# Patient Record
Sex: Female | Born: 1940 | Race: White | Hispanic: No | Marital: Married
Health system: Southern US, Community
[De-identification: ages and names within clinical notes are randomized; demographics above are authoritative.]

## PROBLEM LIST (undated history)

## (undated) HISTORY — PX: ABDOMINAL HYSTERECTOMY: SHX81

---

## 2016-09-16 ENCOUNTER — Other Ambulatory Visit: Payer: Self-pay | Admitting: Internal Medicine

## 2016-09-16 DIAGNOSIS — N183 Chronic kidney disease, stage 3 unspecified: Secondary | ICD-10-CM

## 2016-10-07 ENCOUNTER — Ambulatory Visit
Admission: RE | Admit: 2016-10-07 | Discharge: 2016-10-07 | Disposition: A | Discharge status: Home / Self Care | Payer: Medicare Other | Source: Ambulatory Visit | Attending: Internal Medicine | Admitting: Internal Medicine

## 2016-10-07 DIAGNOSIS — N183 Chronic kidney disease, stage 3 unspecified: Secondary | ICD-10-CM

## 2016-10-07 DIAGNOSIS — N261 Atrophy of kidney (terminal): Secondary | ICD-10-CM | POA: Insufficient documentation

## 2017-09-19 ENCOUNTER — Other Ambulatory Visit: Payer: Self-pay | Admitting: Internal Medicine

## 2017-09-19 DIAGNOSIS — Z1231 Encounter for screening mammogram for malignant neoplasm of breast: Secondary | ICD-10-CM

## 2017-10-21 ENCOUNTER — Encounter: Payer: Self-pay | Admitting: Radiology

## 2017-10-21 ENCOUNTER — Other Ambulatory Visit: Payer: Self-pay | Admitting: Internal Medicine

## 2017-10-21 ENCOUNTER — Ambulatory Visit
Admission: RE | Admit: 2017-10-21 | Discharge: 2017-10-21 | Disposition: A | Discharge status: Home / Self Care | Payer: Medicare HMO | Source: Ambulatory Visit | Attending: Internal Medicine | Admitting: Internal Medicine

## 2017-10-21 DIAGNOSIS — Z1231 Encounter for screening mammogram for malignant neoplasm of breast: Secondary | ICD-10-CM | POA: Insufficient documentation

## 2018-09-26 ENCOUNTER — Other Ambulatory Visit: Payer: Self-pay | Admitting: Internal Medicine

## 2018-09-26 DIAGNOSIS — Z1231 Encounter for screening mammogram for malignant neoplasm of breast: Secondary | ICD-10-CM

## 2019-01-17 ENCOUNTER — Ambulatory Visit
Admission: RE | Admit: 2019-01-17 | Discharge: 2019-01-17 | Disposition: A | Discharge status: Home / Self Care | Payer: Medicare HMO | Source: Ambulatory Visit | Attending: Internal Medicine | Admitting: Internal Medicine

## 2019-01-17 ENCOUNTER — Other Ambulatory Visit: Payer: Self-pay

## 2019-01-17 DIAGNOSIS — Z1231 Encounter for screening mammogram for malignant neoplasm of breast: Secondary | ICD-10-CM | POA: Insufficient documentation

## 2019-06-20 ENCOUNTER — Other Ambulatory Visit: Payer: Self-pay | Admitting: Nephrology

## 2019-06-20 DIAGNOSIS — M7989 Other specified soft tissue disorders: Secondary | ICD-10-CM

## 2019-06-20 DIAGNOSIS — R52 Pain, unspecified: Secondary | ICD-10-CM

## 2019-06-27 ENCOUNTER — Ambulatory Visit
Admission: RE | Admit: 2019-06-27 | Discharge: 2019-06-27 | Disposition: A | Discharge status: Home / Self Care | Payer: Medicare HMO | Source: Ambulatory Visit | Attending: Nephrology | Admitting: Nephrology

## 2019-06-27 ENCOUNTER — Other Ambulatory Visit: Payer: Self-pay

## 2019-06-27 DIAGNOSIS — M7989 Other specified soft tissue disorders: Secondary | ICD-10-CM | POA: Insufficient documentation

## 2019-06-27 DIAGNOSIS — R52 Pain, unspecified: Secondary | ICD-10-CM | POA: Diagnosis present

## 2019-12-16 IMAGING — MG DIGITAL SCREENING BILATERAL MAMMOGRAM WITH TOMO AND CAD
6 of 10 series · 6 of 30 positions shown · non-contrast
Comparison: Previous exam(s).

CLINICAL DATA: Screening.

EXAM:
DIGITAL SCREENING BILATERAL MAMMOGRAM WITH TOMO AND CAD

[R MLO synth-2D (1 of 2)]
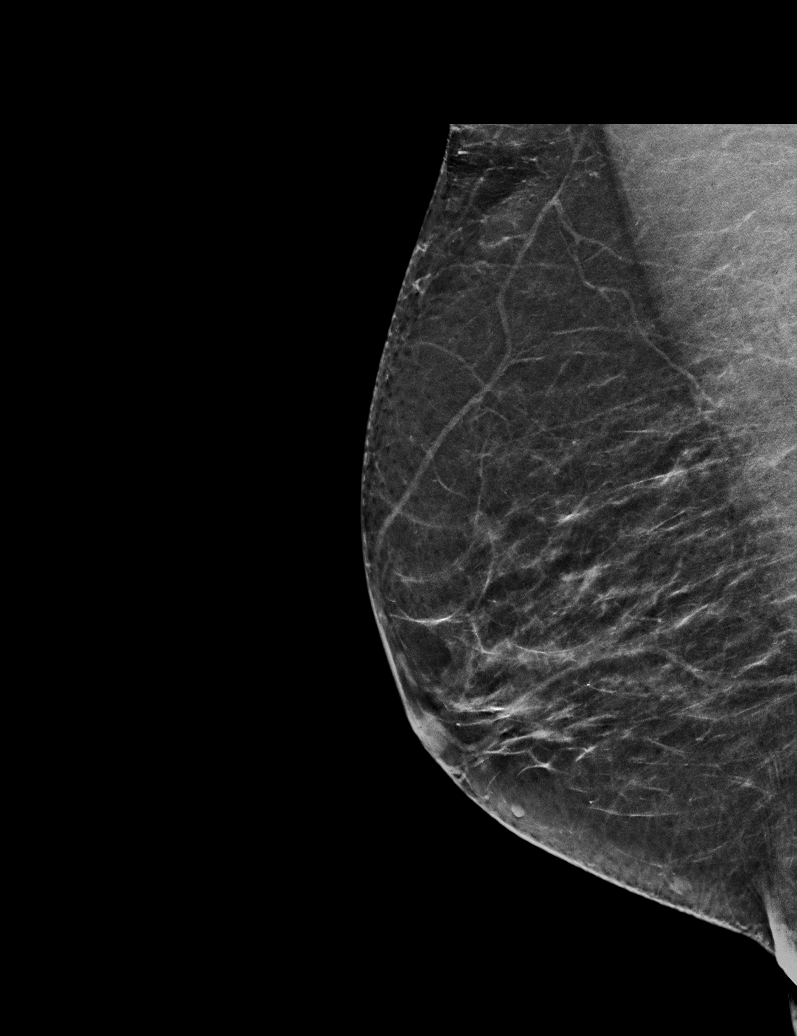

[L CC synth-2D]
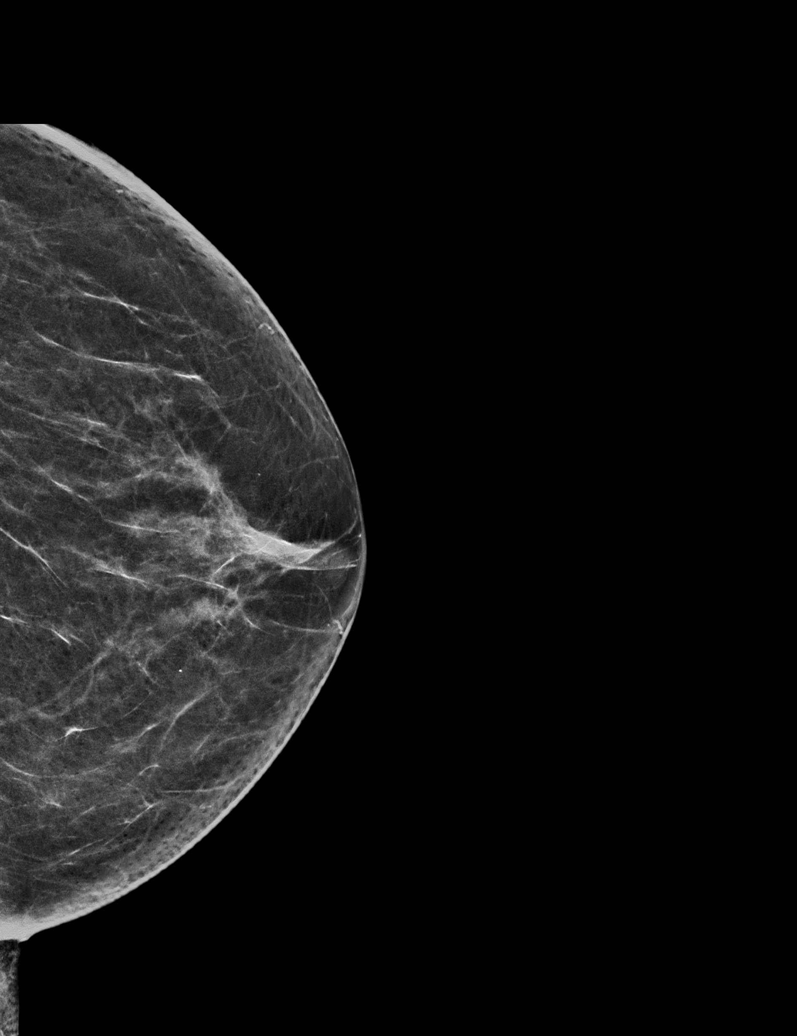

[L MLO synth-2D]
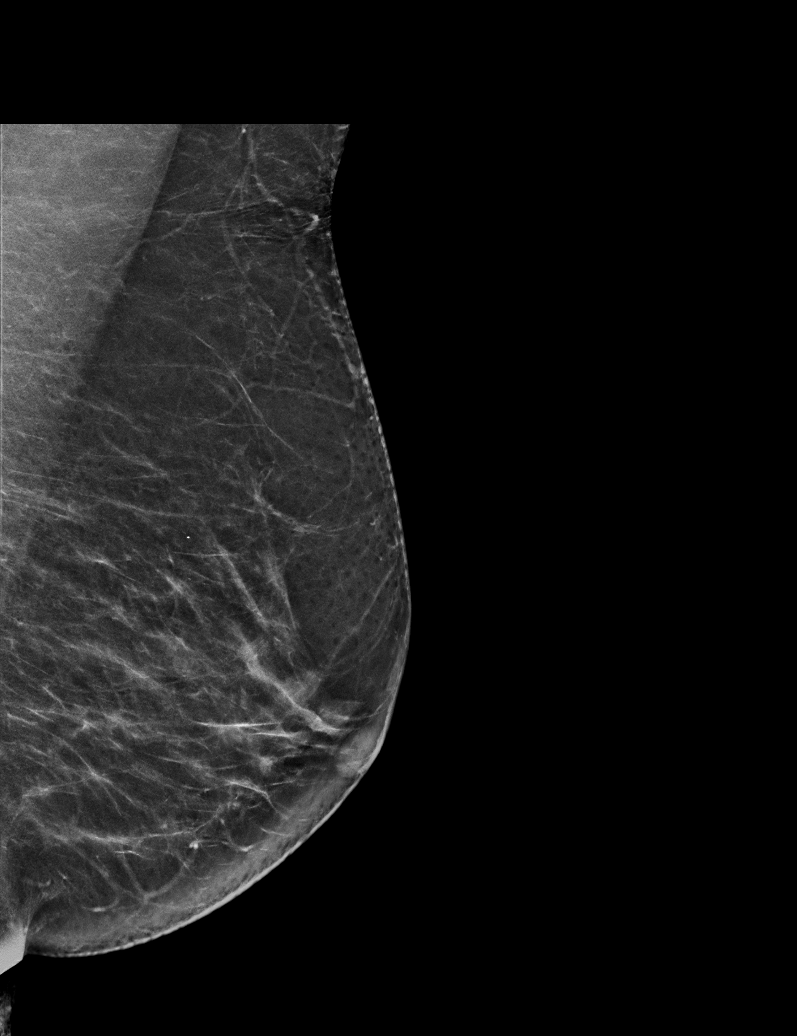

[R MLO synth-2D (2 of 2)]
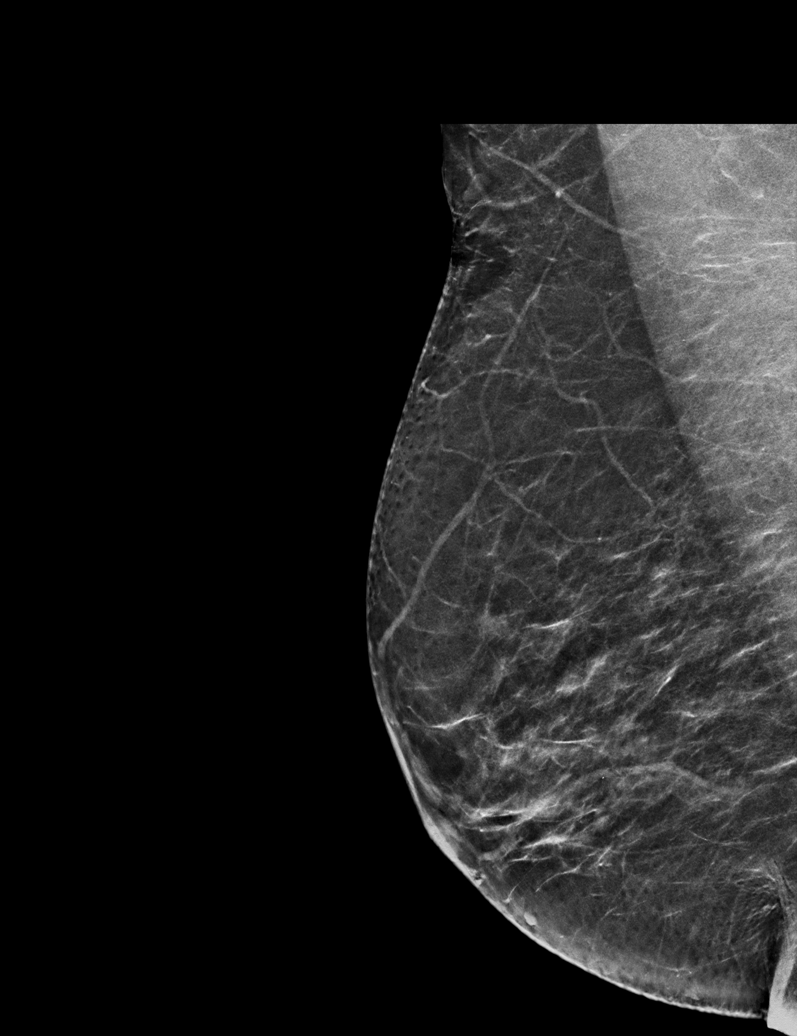

[R CC synth-2D]
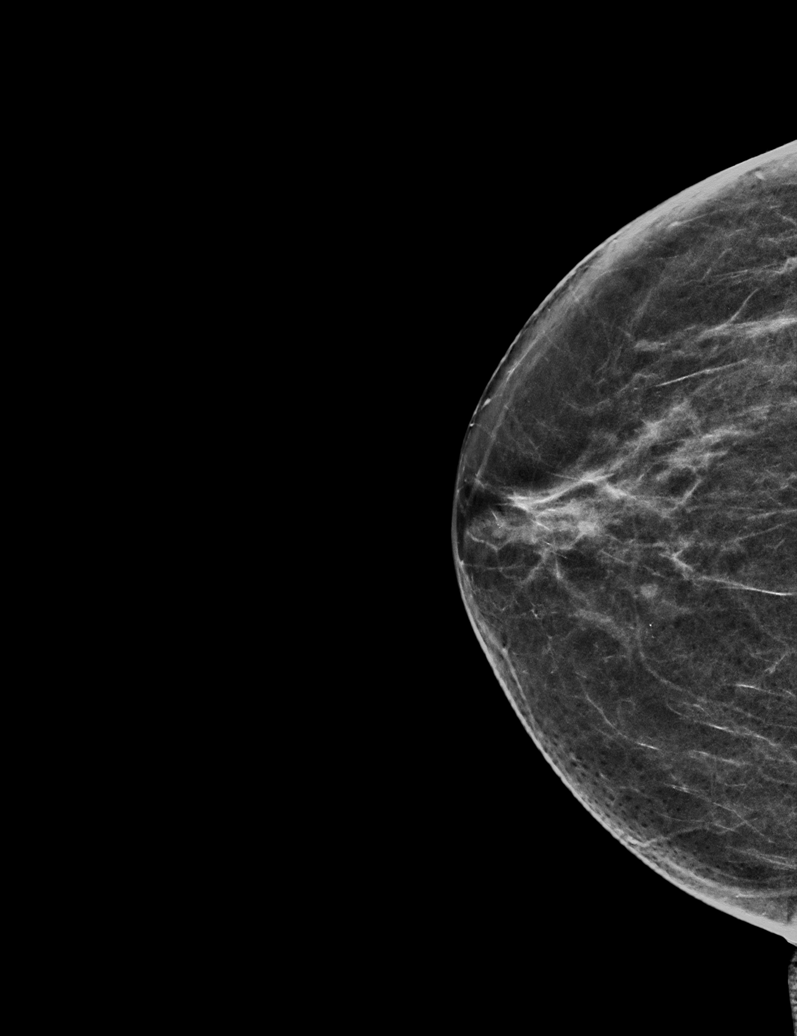

[R MLO tomo · tomo slice 30/59.0]
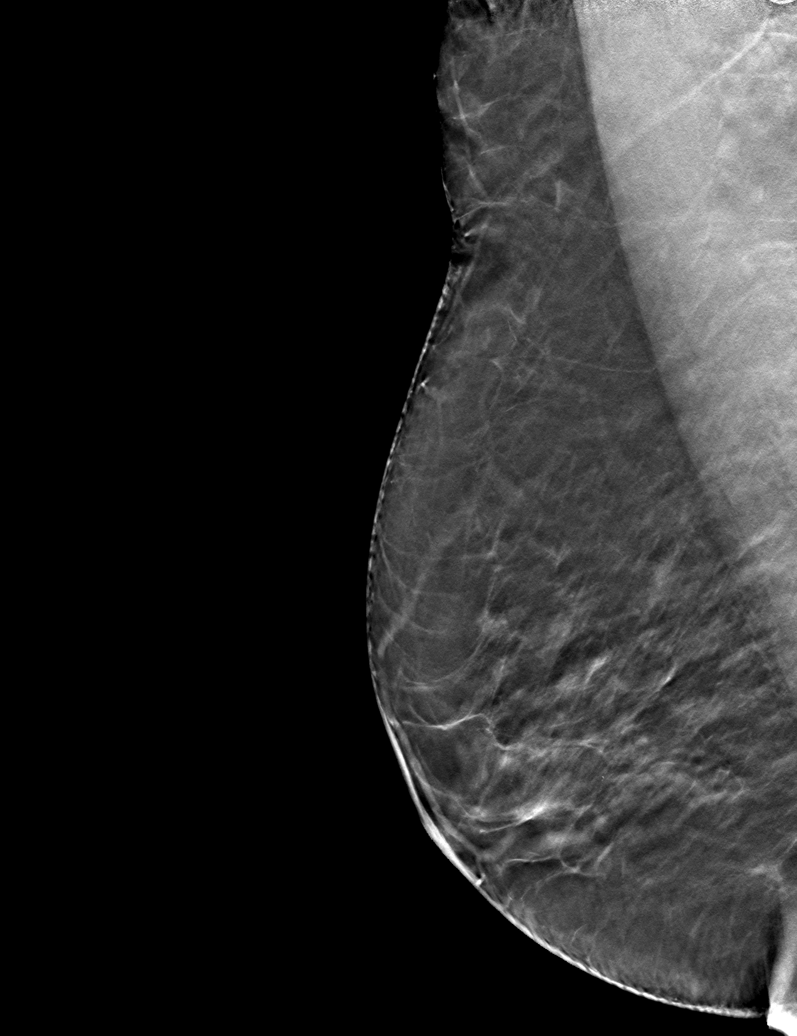

[6 of 30 positions shown; findings below may reference images not displayed]

ACR Breast Density Category b: There are scattered areas of
fibroglandular density.
FINDINGS: There are no findings suspicious for malignancy. Images were
processed with CAD.
IMPRESSION: No mammographic evidence of malignancy. A result letter of this
screening mammogram will be mailed directly to the patient.

RECOMMENDATION:
Screening mammogram in one year. (Code:[id number])

BI-RADS CATEGORY  1: Negative.

## 2019-12-31 ENCOUNTER — Emergency Department: Payer: Medicare HMO

## 2019-12-31 ENCOUNTER — Encounter: Payer: Self-pay | Admitting: Radiology

## 2019-12-31 ENCOUNTER — Emergency Department
Admission: EM | Admit: 2019-12-31 | Discharge: 2019-12-31 | Disposition: A | Discharge status: Home / Self Care | Payer: Medicare HMO | Attending: Emergency Medicine | Admitting: Emergency Medicine

## 2019-12-31 DIAGNOSIS — Z79899 Other long term (current) drug therapy: Secondary | ICD-10-CM | POA: Insufficient documentation

## 2019-12-31 DIAGNOSIS — R040 Epistaxis: Secondary | ICD-10-CM | POA: Insufficient documentation

## 2019-12-31 DIAGNOSIS — Z7982 Long term (current) use of aspirin: Secondary | ICD-10-CM | POA: Diagnosis not present

## 2019-12-31 DIAGNOSIS — I129 Hypertensive chronic kidney disease with stage 1 through stage 4 chronic kidney disease, or unspecified chronic kidney disease: Secondary | ICD-10-CM | POA: Diagnosis not present

## 2019-12-31 DIAGNOSIS — N184 Chronic kidney disease, stage 4 (severe): Secondary | ICD-10-CM | POA: Diagnosis not present

## 2019-12-31 DIAGNOSIS — I160 Hypertensive urgency: Secondary | ICD-10-CM

## 2019-12-31 LAB — CBC WITH DIFFERENTIAL/PLATELET
Abs Immature Granulocytes: 0.04 10*3/uL (ref 0.00–0.07)
Basophils Absolute: 0.1 10*3/uL (ref 0.0–0.1)
Basophils Relative: 1 %
Eosinophils Absolute: 0.3 10*3/uL (ref 0.0–0.5)
Eosinophils Relative: 3 %
HCT: 31.8 % — ABNORMAL LOW (ref 36.0–46.0)
Hemoglobin: 10.3 g/dL — ABNORMAL LOW (ref 12.0–15.0)
Immature Granulocytes: 0 %
Lymphocytes Relative: 18 %
Lymphs Abs: 2 10*3/uL (ref 0.7–4.0)
MCH: 31.1 pg (ref 26.0–34.0)
MCHC: 32.4 g/dL (ref 30.0–36.0)
MCV: 96.1 fL (ref 80.0–100.0)
Monocytes Absolute: 0.7 10*3/uL (ref 0.1–1.0)
Monocytes Relative: 6 %
Neutro Abs: 7.8 10*3/uL — ABNORMAL HIGH (ref 1.7–7.7)
Neutrophils Relative %: 72 %
Platelets: 319 10*3/uL (ref 150–400)
RBC: 3.31 MIL/uL — ABNORMAL LOW (ref 3.87–5.11)
RDW: 12.7 % (ref 11.5–15.5)
WBC: 10.9 10*3/uL — ABNORMAL HIGH (ref 4.0–10.5)
nRBC: 0 % (ref 0.0–0.2)

## 2019-12-31 LAB — COMPREHENSIVE METABOLIC PANEL
ALT: 14 U/L (ref 0–44)
AST: 17 U/L (ref 15–41)
Albumin: 3.7 g/dL (ref 3.5–5.0)
Alkaline Phosphatase: 74 U/L (ref 38–126)
Anion gap: 9 (ref 5–15)
BUN: 55 mg/dL — ABNORMAL HIGH (ref 8–23)
CO2: 27 mmol/L (ref 22–32)
Calcium: 9.7 mg/dL (ref 8.9–10.3)
Chloride: 104 mmol/L (ref 98–111)
Creatinine, Ser: 2.56 mg/dL — ABNORMAL HIGH (ref 0.44–1.00)
GFR calc Af Amer: 20 mL/min — ABNORMAL LOW (ref >60)
GFR calc non Af Amer: 17 mL/min — ABNORMAL LOW (ref >60)
Glucose, Bld: 117 mg/dL — ABNORMAL HIGH (ref 70–99)
Potassium: 5.2 mmol/L — ABNORMAL HIGH (ref 3.5–5.1)
Sodium: 140 mmol/L (ref 135–145)
Total Bilirubin: 0.6 mg/dL (ref 0.3–1.2)
Total Protein: 7.9 g/dL (ref 6.5–8.1)

## 2019-12-31 LAB — URINALYSIS, COMPLETE (UACMP) WITH MICROSCOPIC
Bilirubin Urine: NEGATIVE
Glucose, UA: NEGATIVE mg/dL
Hgb urine dipstick: NEGATIVE
Ketones, ur: NEGATIVE mg/dL
Leukocytes,Ua: NEGATIVE
Nitrite: NEGATIVE
Protein, ur: 30 mg/dL — AB
Specific Gravity, Urine: 1.01 (ref 1.005–1.030)
pH: 6 (ref 5.0–8.0)

## 2019-12-31 LAB — TROPONIN I (HIGH SENSITIVITY)
Troponin I (High Sensitivity): 11 ng/L (ref <18)
Troponin I (High Sensitivity): 11 ng/L (ref <18)

## 2019-12-31 MED ORDER — CLONIDINE HCL 0.1 MG PO TABS
0.1000 mg | ORAL_TABLET | Freq: Once | ORAL | Status: AC
  Administered 2019-12-31: 0.1 mg via ORAL
  Filled 2019-12-31: qty 1

## 2019-12-31 MED ORDER — CLONIDINE HCL 0.1 MG PO TABS: 0.1000 mg | ORAL_TABLET | Freq: Two times a day (BID) | ORAL | 0 refills | Status: AC | PRN

## 2019-12-31 MED ORDER — METOPROLOL TARTRATE 5 MG/5ML IV SOLN
5.0000 mg | Freq: Once | INTRAVENOUS | Status: AC
  Administered 2019-12-31: 5 mg via INTRAVENOUS
  Filled 2019-12-31: qty 5

## 2019-12-31 NOTE — ED Triage Notes (Signed)
Pt from home via [facility] with complaint of HTN and nosebleed lasting approx 1 hr. Per EMS, pt seated BP was 220/160. Pt has stage IV kidney failure, no known history of stroke or MI. Pt A&O x4 on arrival, nose not currently bleeding at this time

## 2019-12-31 NOTE — Discharge Instructions (Signed)
1.  Make sure to take your blood pressure medicines every day as directed by your doctor. 2.  You may take Clonidine 0.1 mg up to twice daily for the following: Top blood pressure number>200 Bottom blood pressure number>100 3.  Return to the ER for worsening symptoms, persistent vomiting, difficulty breathing or other concerns.

## 2019-12-31 NOTE — ED Provider Notes (Signed)
[facility] Emergency Department Provider Note   ____________________________________________   First MD Initiated Contact with Patient 12/31/19 0128     (approximate)  I have reviewed the triage vital signs and the nursing notes.   HISTORY  Chief Complaint Hypertension and Epistaxis    HPI Cassie Hayes is a 79 y.o. female brought to the ED from home via EMS with a chief complaint of hypertension and epistaxis.  Patient has a history of hypertension, CKD, hyperparathyroidism, anemia who is on losartan, felodipine, furosemide and metoprolol.  Initially called EMS for left-sided nosebleed.  EMS reports they worked with the patient for 45 minutes at her house.  Nosebleed resolved without intervention.  However, EMS reports significantly elevated  blood pressures in the 260s/160 range.  Patient denies associated headache, vision changes, neck pain, chest pain, shortness of breath, abdominal pain, nausea, vomiting or dizziness.  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  Denies recent travel or trauma.  Denies anticoagulant use.       Past Medical History . Chronic kidney disease, Stage IV (severe) ([place])  . Proteinuria  . 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 Secondary hyperparathyroidism of renal origin ([place])  . Anemia in chronic kidney disease  . Hematuria  . Hyperkalemia   There are no problems to display for this patient.   Past Surgical History:  Procedure Laterality Date  . ABDOMINAL HYSTERECTOMY      Prior to Admission medications   Medication Sig Start Date End Date Taking? Authorizing Provider  cloNIDine (CATAPRES) 0.1 MG tablet Take 1 tablet (0.1 mg total) by mouth 2 (two) times daily as needed. 12/31/19   Paulette Blanch, MD  . losartan (COZAAR) 50 MG tablet, Take 2 tablets  (100 mg total) by mouth 1 (one) time each day, Disp: 180 tablet, Rfl: 3 . Ascorbic Acid (Vitamin C) 500 MG capsule, Take 1 capsule by mouth 1 (one) time each day, Disp: , Rfl:  . aspirin (ST JOSEPH) 81 MG EC tablet, Take 1 tablet by mouth 1 (one) time each day, Disp: , Rfl:  . Calcium Carb-Cholecalciferol (Calcium 500 + D) 500-200 MG-UNIT tablet, Take 1 tablet by mouth 1 (one) time each day, Disp: , Rfl:  . Cholecalciferol (Vitamin D3) 50 MCG (2000 UT) tablet, Take 1 capsule by mouth 1 (one) time each day, Disp: , Rfl:  . felodipine (PLENDIL) 5 MG 24 hr tablet, Take 1 tablet by mouth 1 (one) time each day, Disp: , Rfl:  . furosemide (LASIX) 20 MG tablet, Take 1 tablet (20 mg total) by mouth 1 (one) time each day Take as needed for swelling of feet., Disp: 90 tablet, Rfl: 3 . levothyroxine (SYNTHROID, LEVOTHROID) 100 MCG tablet, Comments:  Filled Date: Apr 13 2018 12:00AM Duration: 90, Disp: , Rfl:  . metoprolol succinate XL (TOPROL-XL) 50 MG 24 hr tablet, Take 1 tablet by mouth 1 (one) time each day, Disp: , Rfl:  . venlafaxine XR (EFFEXOR-XR) 37.5 MG 24 hr capsule, Take 37.5 mg by mouth daily, Disp: , Rfl:  . Vitamins-Lipotropics (MULTI-VITAMIN HP/MINERALS PO), Take 1 capsule by mouth 1 (one) time each day, Disp: , Rfl:     Allergies Patient has no allergy information on record.  No family history on file.  Social History Social History   Tobacco Use  . Smoking status: Not on  file  Substance Use Topics  . Alcohol use: Not on file  . Drug use: Not on file    Review of Systems  Constitutional: No fever/chills Eyes: No visual changes. ENT: Positive for left-sided nosebleed.  No sore throat. Cardiovascular: Denies chest pain. Respiratory: Denies shortness of breath. Gastrointestinal: No abdominal pain.  No nausea, no vomiting.  No diarrhea.  No constipation. Genitourinary: Negative for dysuria. Musculoskeletal: Negative for back pain. Skin: Negative for rash. Neurological:  Negative for headaches, focal weakness or numbness.   ____________________________________________   PHYSICAL EXAM:  VITAL SIGNS: ED Triage Vitals  Enc Vitals Group     BP 12/31/19 0124 (!) 213/126     Pulse Rate 12/31/19 0124 (!) 111     Resp 12/31/19 0124 20     Temp 12/31/19 0124 98.6 F (37 C)     Temp Source 12/31/19 0124 Oral     SpO2 12/31/19 0124 98 %     Weight 12/31/19 0127 175 lb (79.4 kg)     Height 12/31/19 0127 5\' 2"  (1.575 m)     Head Circumference --      Peak Flow --      Pain Score 12/31/19 0125 0     Pain Loc --      Pain Edu? --      Excl. in [place]? --     Constitutional: Alert and oriented. Well appearing and in no acute distress. Eyes: Conjunctivae are normal. PERRL. EOMI. Head: Atraumatic. Nose: No active bleeding.  Clot noted in left naris.. Mouth/Throat: Mucous membranes are moist.  No bleeding in posterior oropharynx. Neck: No stridor.   Cardiovascular: Tachycardic rate, regular rhythm. Grossly normal heart sounds.  Good peripheral circulation. Respiratory: Normal respiratory effort.  No retractions. Lungs CTAB. Gastrointestinal: Soft and nontender to light or deep palpation. No distention. No abdominal bruits. No CVA tenderness. Musculoskeletal: No lower extremity tenderness nor edema.  No joint effusions. Neurologic: Alert and oriented x3.  CN II-XII grossly intact.  Normal speech and language. No gross focal neurologic deficits are appreciated.  Skin:  Skin is warm, dry and intact. No rash noted. Psychiatric: Mood and affect are normal. Speech and behavior are normal.  ____________________________________________   LABS (all labs ordered are listed, but only abnormal results are displayed)  Labs Reviewed  CBC WITH DIFFERENTIAL/PLATELET - Abnormal; Notable for the following components:      Result Value   WBC 10.9 (*)    RBC 3.31 (*)    Hemoglobin 10.3 (*)    HCT 31.8 (*)    Neutro Abs 7.8 (*)    All other components within normal limits   COMPREHENSIVE METABOLIC PANEL - Abnormal; Notable for the following components:   Potassium 5.2 (*)    Glucose, Bld 117 (*)    BUN 55 (*)    Creatinine, Ser 2.56 (*)    GFR calc non Af Amer 17 (*)    GFR calc Af Amer 20 (*)    All other components within normal limits  URINALYSIS, COMPLETE (UACMP) WITH MICROSCOPIC - Abnormal; Notable for the following components:   Color, Urine YELLOW (*)    APPearance CLEAR (*)    Protein, ur 30 (*)    Bacteria, UA RARE (*)    All other components within normal limits  TROPONIN I (HIGH SENSITIVITY)  TROPONIN I (HIGH SENSITIVITY)   ____________________________________________  EKG  ED ECG REPORT I, Raye Wiens J, the attending physician, personally viewed and interpreted this ECG.   Date: 12/31/2019  EKG Time: 0125  Rate: 109  Rhythm: sinus tachycardia  Axis: Normal  Intervals:right bundle branch block  ST&T Change: Nonspecific  ____________________________________________  RADIOLOGY  ED MD interpretation: No acute cardiopulmonary process  Official radiology report(s): DG Chest Port 1 View  Result Date: 12/31/2019 CLINICAL DATA:  Hypertension EXAM: PORTABLE CHEST 1 VIEW COMPARISON:  None. FINDINGS: The heart size and mediastinal contours are within normal limits. Both lungs are clear. The visualized skeletal structures are unremarkable. IMPRESSION: No active disease. Electronically Signed   By: Inez Catalina M.D.   On: 12/31/2019 01:50    ____________________________________________   PROCEDURES  Procedure(s) performed (including Critical Care):  .1-3 Lead EKG Interpretation Performed by: Paulette Blanch, MD Authorized by: Paulette Blanch, MD     Interpretation: abnormal     ECG rate:  105   ECG rate assessment: tachycardic     Rhythm: sinus tachycardia     Ectopy: none     Conduction: normal   Comments:     Patient placed on cardiac monitor to monitor for  arrhythmias     ____________________________________________   INITIAL IMPRESSION / ASSESSMENT AND PLAN / ED COURSE  As part of my medical decision making, I reviewed the following data within the [place] notes reviewed and incorporated, Labs reviewed, EKG interpreted, Radiograph reviewed and Notes from prior ED visits     Cassie Hayes was evaluated in Emergency Department on 12/31/2019 for the symptoms described in the history of present illness. She was evaluated in the context of the global COVID-19 pandemic, which necessitated consideration that the patient might be at risk for infection with the SARS-CoV-2 virus that causes COVID-19. Institutional protocols and algorithms that pertain to the evaluation of patients at risk for COVID-19 are in a state of rapid change based on information released by regulatory bodies including the CDC and federal and state organizations. These policies and algorithms were followed during the patient's care in the ED.    79 year old female presenting with left-sided nosebleed likely secondary to hypertensive urgency.  Differential diagnosis includes but is not limited to ACS, CHF, infectious, metabolic etiologies, etc.  No nosebleed upon arrival.  Obtain basic lab work, UA, chest x-ray.  5 mg IV Lopressor for hypertension.  Will reassess. Clinical Course as of Dec 31 706  Mon Dec 31, 2019  0302 BP 173/92.  Patient resting without complaints.  Creatinine 1.84 on 10/20/2019.  Awaiting urine specimen.   [JS]  3716 BP 200/94.  Patient feels fine and desires to go home.  Agreeable to trial oral clonidine.   [JS]  0618 BP 180/95; patient without complaints.  Will discharge home on Clonidine to use as needed with strict parameters.  Strict return precautions given.  Patient verbalizes understanding and agrees with plan of care.   [JS]    Clinical Course User Index [JS] Paulette Blanch, MD      ____________________________________________   FINAL CLINICAL IMPRESSION(S) / ED DIAGNOSES  Final diagnoses:  Epistaxis  Hypertensive urgency     ED Discharge Orders         Ordered    cloNIDine (CATAPRES) 0.1 MG tablet  2 times daily PRN     12/31/19 0620           Note:  This document was prepared using Dragon voice recognition software and may include unintentional dictation errors.   Paulette Blanch, MD 12/31/19 [phone]

## 2020-01-02 ENCOUNTER — Other Ambulatory Visit: Payer: Self-pay | Admitting: Internal Medicine

## 2020-01-02 DIAGNOSIS — I10 Essential (primary) hypertension: Secondary | ICD-10-CM

## 2020-01-02 DIAGNOSIS — N184 Chronic kidney disease, stage 4 (severe): Secondary | ICD-10-CM

## 2020-01-09 ENCOUNTER — Other Ambulatory Visit: Payer: Self-pay

## 2020-01-09 ENCOUNTER — Ambulatory Visit
Admission: RE | Admit: 2020-01-09 | Discharge: 2020-01-09 | Disposition: A | Discharge status: Home / Self Care | Payer: Medicare HMO | Source: Ambulatory Visit | Attending: Internal Medicine | Admitting: Internal Medicine

## 2020-01-09 DIAGNOSIS — N184 Chronic kidney disease, stage 4 (severe): Secondary | ICD-10-CM

## 2020-01-09 DIAGNOSIS — I10 Essential (primary) hypertension: Secondary | ICD-10-CM | POA: Insufficient documentation

## 2020-12-07 IMAGING — US US RENAL
1 series · 14 of 25 positions shown · non-contrast
Comparison: None.

CLINICAL DATA: Chronic renal disease.

EXAM:
RENAL / URINARY TRACT ULTRASOUND COMPLETE

[Series 1: us renal · 0.19mm/px · 14 of 60 slices shown]
[im 1/60]
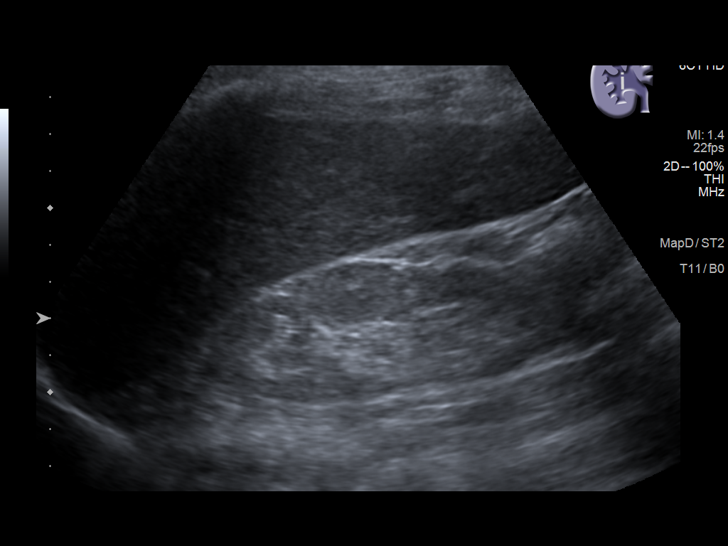
[im 5/60]
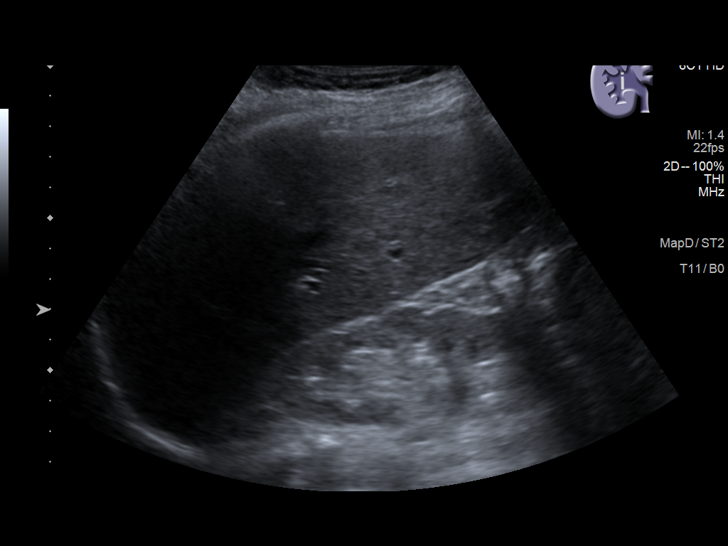
[im 10/60]
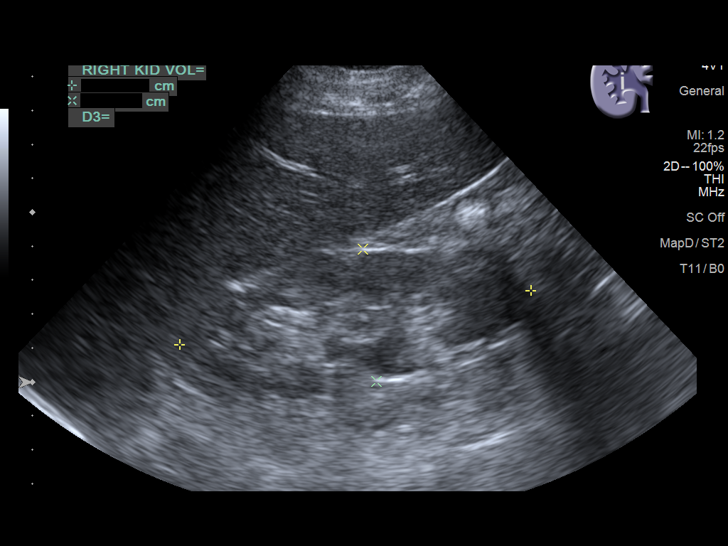
[im 15/60]
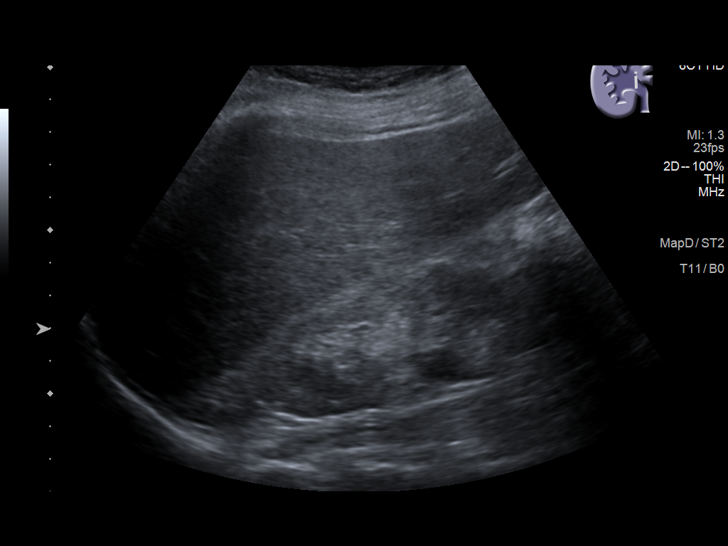
[im 20/60]
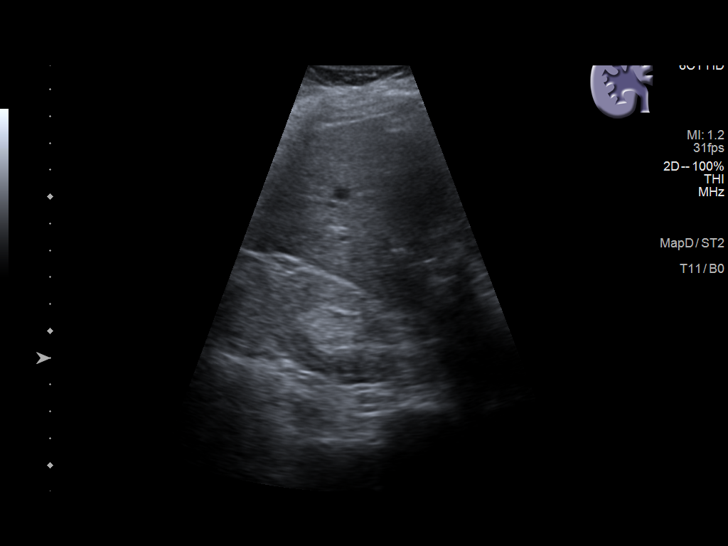
[im 23/60]
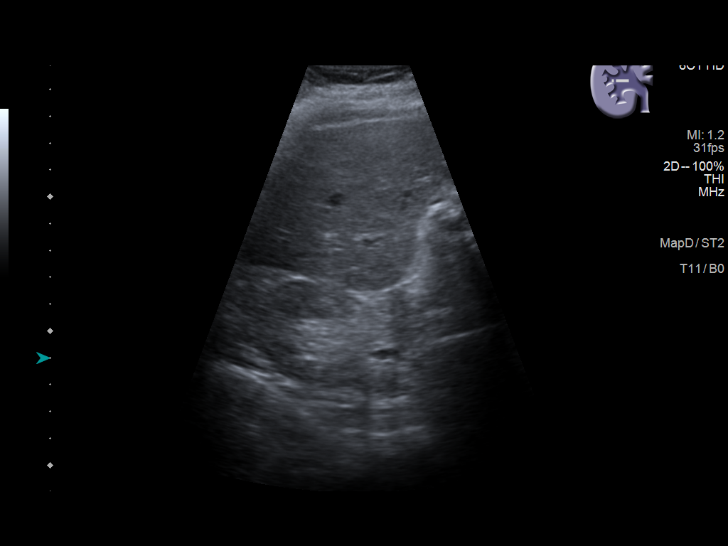
[im 28/60]
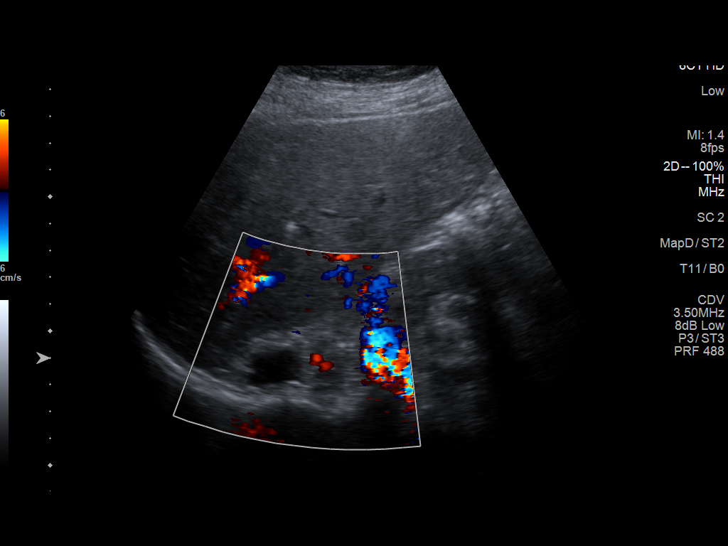
[im 32/60]
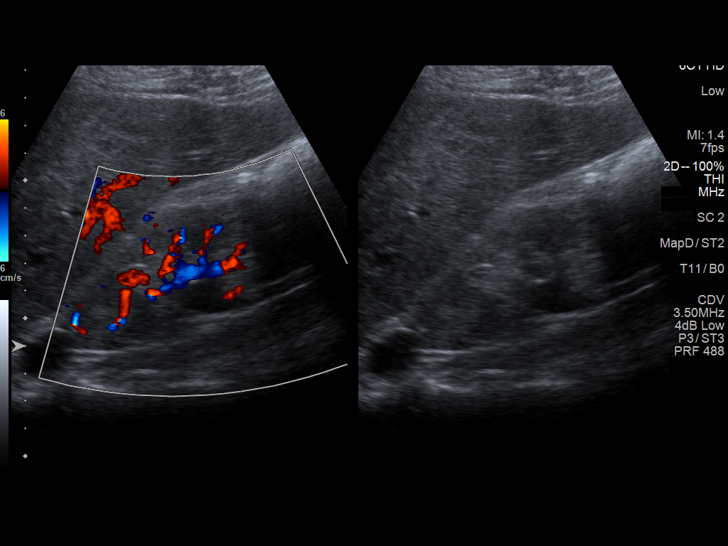
[im 37/60]
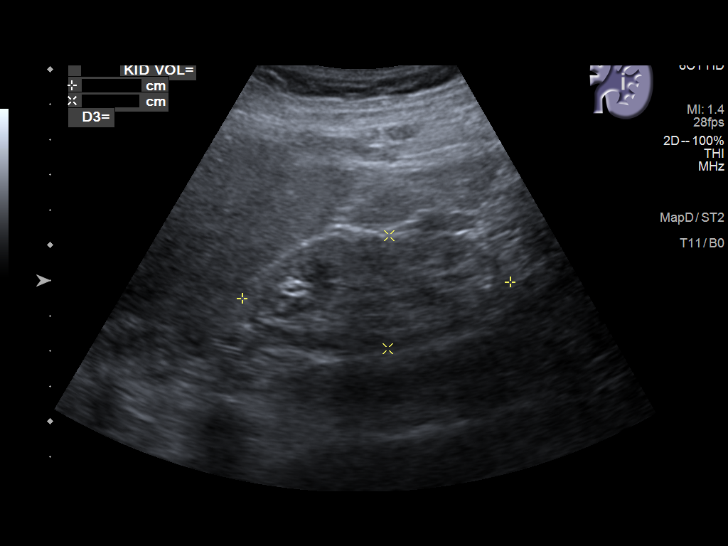
[im 40/60]
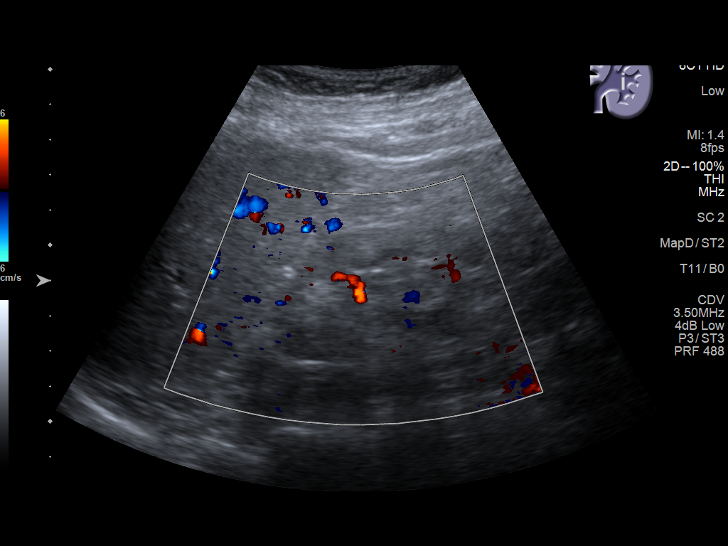
[im 45/60]
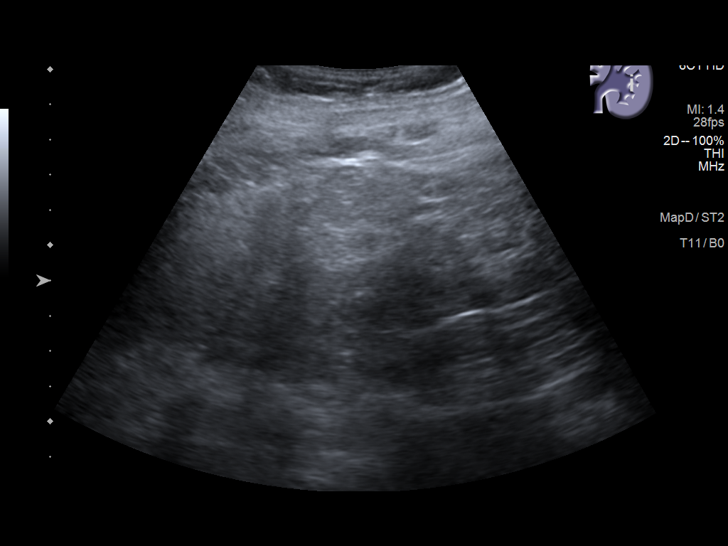
[im 50/60]
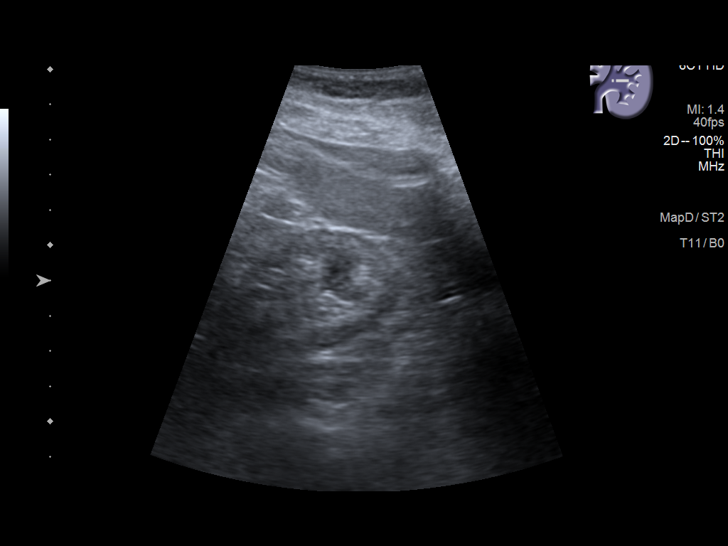
[im 55/60]
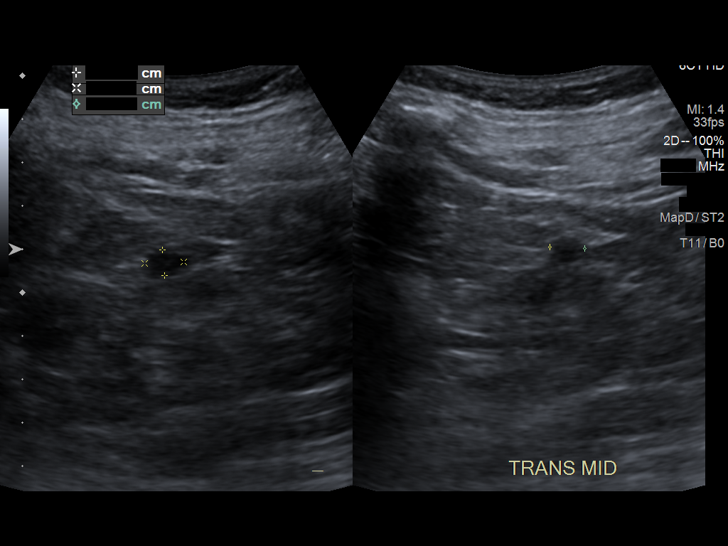
[im 60/60]
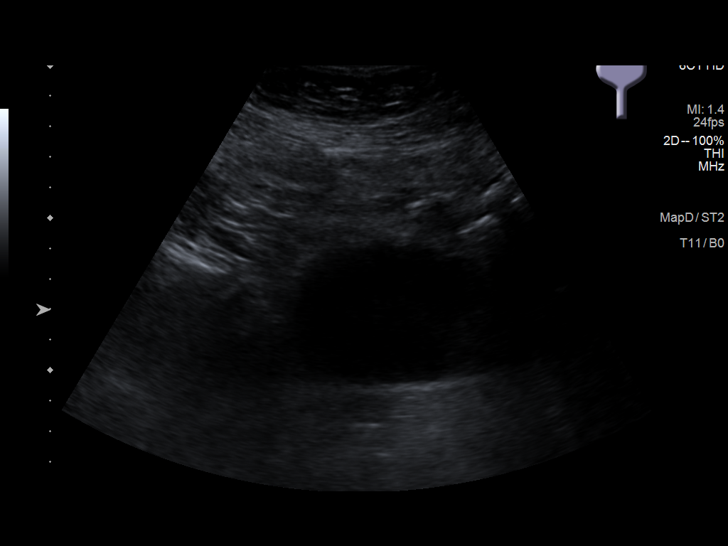

[14 of 25 positions shown; findings below may reference images not displayed]

FINDINGS: Right Kidney:

Renal measurements: 10.5 cm x 3.9 cm x 4.4 cm = volume: 94.0 mL.
There is diffusely increased echogenicity of the right kidney. A
cm x 1.8 cm x 1.8 cm anechoic structure is seen within the upper
pole of the right kidney. No hydronephrosis is visualized.

Left Kidney:

Renal measurements: 7.6 cm x 3.2 cm x 3.4 cm = volume: 43.7 mL.
There is diffusely increased echogenicity of the left kidney. Mild
renal cortical thinning is seen on the left. A 0.9 cm x 0.8 cm x
cm exophytic anechoic structure is seen along the lower pole of the
left kidney. No hydronephrosis is visualized.

Bladder:

Appears normal for degree of bladder distention.

Other:

None.
IMPRESSION: 1. Mild left-sided renal cortical thinning.
2. Small bilateral renal cysts.
3. Increased echogenicity of both kidneys likely secondary to
medical renal disease.
